# Patient Record
Sex: Male | Born: 1995 | Hispanic: Yes | Marital: Single | Smoking: Never smoker
Health system: Southern US, Community
[De-identification: ages and names within clinical notes are randomized; demographics above are authoritative.]

## PROBLEM LIST (undated history)

## (undated) DIAGNOSIS — Z789 Other specified health status: Secondary | ICD-10-CM

---

## 2018-07-24 ENCOUNTER — Emergency Department (HOSPITAL_COMMUNITY): Payer: Self-pay

## 2018-07-24 ENCOUNTER — Emergency Department (HOSPITAL_COMMUNITY)
Admission: EM | Admit: 2018-07-24 | Discharge: 2018-07-24 | Disposition: A | Discharge status: Home / Self Care | Payer: Self-pay | Attending: Emergency Medicine | Admitting: Emergency Medicine

## 2018-07-24 ENCOUNTER — Encounter (HOSPITAL_COMMUNITY): Payer: Self-pay | Admitting: Emergency Medicine

## 2018-07-24 ENCOUNTER — Other Ambulatory Visit: Payer: Self-pay

## 2018-07-24 DIAGNOSIS — S42025A Nondisplaced fracture of shaft of left clavicle, initial encounter for closed fracture: Secondary | ICD-10-CM | POA: Insufficient documentation

## 2018-07-24 DIAGNOSIS — S0990XA Unspecified injury of head, initial encounter: Secondary | ICD-10-CM | POA: Insufficient documentation

## 2018-07-24 DIAGNOSIS — Z23 Encounter for immunization: Secondary | ICD-10-CM | POA: Insufficient documentation

## 2018-07-24 DIAGNOSIS — Y929 Unspecified place or not applicable: Secondary | ICD-10-CM | POA: Insufficient documentation

## 2018-07-24 DIAGNOSIS — Y999 Unspecified external cause status: Secondary | ICD-10-CM | POA: Insufficient documentation

## 2018-07-24 DIAGNOSIS — W132XXA Fall from, out of or through roof, initial encounter: Secondary | ICD-10-CM

## 2018-07-24 DIAGNOSIS — Y9389 Activity, other specified: Secondary | ICD-10-CM | POA: Insufficient documentation

## 2018-07-24 DIAGNOSIS — W11XXXA Fall on and from ladder, initial encounter: Secondary | ICD-10-CM | POA: Insufficient documentation

## 2018-07-24 HISTORY — DX: Other specified health status: Z78.9

## 2018-07-24 MED ORDER — TETANUS-DIPHTH-ACELL PERTUSSIS 5-2.5-18.5 LF-MCG/0.5 IM SUSP
0.5000 mL | Freq: Once | INTRAMUSCULAR | Status: AC
  Administered 2018-07-24: 0.5 mL via INTRAMUSCULAR
  Filled 2018-07-24: qty 0.5

## 2018-07-24 MED ORDER — HYDROMORPHONE HCL 1 MG/ML IJ SOLN
1.0000 mg | Freq: Once | INTRAMUSCULAR | Status: AC
  Administered 2018-07-24: 1 mg via INTRAVENOUS
  Filled 2018-07-24: qty 1

## 2018-07-24 MED ORDER — CYCLOBENZAPRINE HCL 10 MG PO TABS
10.0000 mg | ORAL_TABLET | Freq: Once | ORAL | Status: AC
  Administered 2018-07-24: 10 mg via ORAL
  Filled 2018-07-24: qty 1

## 2018-07-24 MED ORDER — MORPHINE SULFATE (PF) 4 MG/ML IV SOLN
4.0000 mg | Freq: Once | INTRAVENOUS | Status: AC
  Administered 2018-07-24: 4 mg via INTRAVENOUS
  Filled 2018-07-24: qty 1

## 2018-07-24 MED ORDER — OXYCODONE HCL 5 MG PO TABS: 5.0000 mg | ORAL_TABLET | Freq: Four times a day (QID) | ORAL | 0 refills | Status: AC | PRN

## 2018-07-24 MED ORDER — CYCLOBENZAPRINE HCL 10 MG PO TABS: 10.0000 mg | ORAL_TABLET | Freq: Two times a day (BID) | ORAL | 0 refills | Status: AC | PRN

## 2018-07-24 NOTE — ED Triage Notes (Signed)
PT to ED via EMS after falling around 12 feet from scaffolding. Hematoma to L posterior head. Pain and tenderness to L shoulder. No LOC per patient. C collar in place.  50 mcg Fentanyl given PTA.

## 2018-07-24 NOTE — ED Triage Notes (Signed)
Pt c/o Left shoulder pain and L head pain. Reports that he was on a ladder on the 2nd floor of the house when the ladder slipped from underneath him. Denies any medical hx, surgery, allergies, or substance use. Denies pain to his back legs hip, and neck. Neurological exam negative

## 2018-07-24 NOTE — ED Notes (Signed)
Patient transported to CT 

## 2018-07-24 NOTE — Discharge Instructions (Addendum)
1. Medications: Alternate 600 mg of ibuprofen and [phone] mg of Tylenol every 3 hours as needed for pain. Do not exceed 4000 mg of Tylenol daily.  Take ibuprofen with food to avoid upset stomach issues.  Take oxycodone as needed only for severe pain but do not drive, drink alcohol, operate heavy machinery, or make important decisions while taking this medicine as it may make you drowsy.  The same applies for the medication Flexeril (cyclobenzaprine) which is a muscle relaxer.  I recommend taking these medications only at night when you know you are not driving.  You can also cut these tablets in half if they are too strong. 2. Treatment: rest, apply ice for 20 minutes at a time up to 3 times daily as needed for swelling, use the shoulder sling for comfort but take your arm out of it a couple of times a day and do some stretching to avoid muscle stiffness, drink plenty of fluids.  For concussion symptoms (headache, vision changes, confusion, nausea) I recommend staying in a dimly lit room with minimal stimuli.  Try to reduce screen time (phone, TV) by at least 50% if not more.  Avoid contact sports until cleared by physician. 3. Follow Up: Please followup with orthopedics in 1 week if no improvement for discussion of your diagnoses and further evaluation after today's visit; Please return to the ER for worsening symptoms or other concerns such as worsening swelling, redness of the skin, fevers, loss of pulses, severe headaches, loss of vision, persistent vomiting, or loss of feeling

## 2018-07-24 NOTE — ED Provider Notes (Signed)
[facility] EMERGENCY DEPARTMENT Provider Note   CSN: [id number] Arrival date & time: 07/24/18  1648     History   Chief Complaint Chief Complaint  Patient presents with  . Fall    HPI Christopher Kim is a 22 y.o. male presents for evaluation of acute onset, constant left shoulder and left-sided headache secondary to injury just prior to arrival.  Patient was working on an [facility] unit while on a ladder on the second floor when he states the ladder gave out.  He thinks he fell from a height of around 15 feet.  Denies any prodrome leading up to the fall including lightheadedness, chest pain, or shortness of breath.  He states that his left shoulder struck the ladder and he landed on the left side of his head.  Denies loss of consciousness.  Currently notes headache and left shoulder pain, denies neck pain or low back pain.  He denies chest pain, shortness of breath, numbness, weakness, vision changes, nausea, vomiting, or abdominal pain. Patient is primarily Spanish-speaking, translator was used throughout the encounter.  The history is provided by the patient. The history is limited by a language barrier. A language interpreter was used.    Past Medical History:  Diagnosis Date  . Patient denies medical problems     There are no active problems to display for this patient.   History reviewed. No pertinent surgical history.      Home Medications    Prior to Admission medications   Medication Sig Start Date End Date Taking? Authorizing Provider  cyclobenzaprine (FLEXERIL) 10 MG tablet Take 1 tablet (10 mg total) by mouth 2 (two) times daily as needed for muscle spasms. 07/24/18   Kaylise Blakeley A, PA-C  oxyCODONE (ROXICODONE) 5 MG immediate release tablet Take 1 tablet (5 mg total) by mouth every 6 (six) hours as needed for severe pain. 07/24/18   Jeanie Sewer, PA-C    Family History No family history on file.  Social History Social History   Tobacco Use  . Smoking  status: Never Smoker  . Smokeless tobacco: Never Used  Substance Use Topics  . Alcohol use: Never    Frequency: Never  . Drug use: Never     Allergies   Patient has no known allergies.   Review of Systems Review of Systems  Eyes: Negative for visual disturbance.  Respiratory: Negative for shortness of breath.   Cardiovascular: Negative for chest pain.  Gastrointestinal: Negative for abdominal pain, nausea and vomiting.  Musculoskeletal: Positive for arthralgias.  Skin: Positive for wound.  Neurological: Positive for headaches. Negative for syncope, weakness and numbness.  All other systems reviewed and are negative.    Physical Exam Updated Vital Signs BP 111/87   Pulse 78   Temp 97.7 F (36.5 C)   Resp 18   Ht 5\' 10"  (1.778 m)   Wt 76.7 kg   SpO2 97%   BMI 24.25 kg/m   Physical Exam  Constitutional: He is oriented to person, place, and time. He appears well-developed and well-nourished. No distress.  HENT:  Head: Normocephalic.  Superficial abrasions noted to the left parieto-occipital region.  Bleeding controlled.  There is tenderness to palpation and swelling around this area.  Left hemotympanum noted.  No raccoon eyes, rhinorrhea, or battle signs.  No tenderness to palpation of the face.  No jaw malocclusion.  Eyes: Pupils are equal, round, and reactive to light. Conjunctivae and EOM are normal. Right eye exhibits no discharge. Left  eye exhibits no discharge.  Neck: No JVD present. No tracheal deviation present.  In C-collar  Cardiovascular: Normal rate, regular rhythm, normal heart sounds and intact distal pulses.  2+ radial and DP/PT pulses bilaterally, no lower extremity edema, no palpable cords, compartments are soft   Pulmonary/Chest: Effort normal and breath sounds normal. He exhibits tenderness.  Redness to palpation overlying the left clavicle.  No tenderness to palpation of the chest otherwise.  No flail segment.  Lungs clear to auscultation  bilaterally, speaking in full sentences without difficulty.  Abdominal: Soft. Bowel sounds are normal. He exhibits no distension. There is no tenderness. There is no guarding.  Musculoskeletal: He exhibits no edema.  No midline spine TTP, no paraspinal muscle tenderness, no deformity, crepitus, or step-off noted.  5/5 strength of BUE and BLE major muscle groups.  Examination limited secondary to left shoulder pain.  Pelvis appears stable.  Neurological: He is alert and oriented to person, place, and time. No cranial nerve deficit or sensory deficit. He exhibits normal muscle tone.  Fluent speech with no evidence of dysarthria or aphasia, no facial droop, sensation intact to soft touch of upper and lower extremities bilaterally.  Skin: Skin is warm and dry. No erythema.  Superficial abrasions noted to the left shoulder.  Bleeding controlled  Psychiatric: He has a normal mood and affect. His behavior is normal.  Nursing note and vitals reviewed.    ED Treatments / Results  Labs (all labs ordered are listed, but only abnormal results are displayed) Labs Reviewed - No data to display  EKG None  Radiology Dg Chest 2 View  Result Date: 07/24/2018 CLINICAL DATA:  Trauma EXAM: CHEST - 2 VIEW COMPARISON:  Shoulder radiograph 07/24/2018 FINDINGS: No focal opacity or pleural effusion. Normal cardiomediastinal silhouette. No pneumothorax. Acute minimally displaced left mid clavicle fracture. Possible small calcified granuloma in the right CP angle. IMPRESSION: 1. Acute minimally displaced left mid clavicle fracture. 2. Negative for pneumothorax or acute airspace disease. Electronically Signed   By: Jasmine Pang M.D.   On: 07/24/2018 18:00   Ct Head Wo Contrast  Result Date: 07/24/2018 CLINICAL DATA:  Fall with head injury. EXAM: CT HEAD WITHOUT CONTRAST CT CERVICAL SPINE WITHOUT CONTRAST TECHNIQUE: Multidetector CT imaging of the head and cervical spine was performed following the standard protocol  without intravenous contrast. Multiplanar CT image reconstructions of the cervical spine were also generated. COMPARISON:  None. FINDINGS: CT HEAD FINDINGS Brain: There is no evidence for acute hemorrhage, hydrocephalus, mass lesion, or abnormal extra-axial fluid collection. No definite CT evidence for acute infarction. Vascular: No hyperdense vessel or unexpected calcification. Skull: No evidence for fracture. No worrisome lytic or sclerotic lesion. Sinuses/Orbits: Visualized portion the upper right maxillary sinus is opacified. The remaining visualized paranasal sinuses and mastoid air cells are clear. Visualized portions of the globes and intraorbital fat are unremarkable. Other: Left parietooccipital scalp hematoma evident. CT CERVICAL SPINE FINDINGS Alignment: Reversal of normal cervical lordosis.  No subluxation. Skull base and vertebrae: No acute fracture. No primary bone lesion or focal pathologic process. Soft tissues and spinal canal: No prevertebral fluid or swelling. No visible canal hematoma. Disc levels: Intervertebral disc spaces are preserved throughout. The facets are well aligned bilaterally. Upper chest: Negative. Other: None. IMPRESSION: 1. Left parietooccipital scalp hematoma without acute intracranial abnormality. 2. No cervical spine fracture. 3. Loss of cervical lordosis. This can be related to patient positioning, muscle spasm or soft tissue injury. Electronically Signed   By: Jamison Oka.D.  On: 07/24/2018 18:22   Ct Cervical Spine Wo Contrast  Result Date: 07/24/2018 CLINICAL DATA:  Fall with head injury. EXAM: CT HEAD WITHOUT CONTRAST CT CERVICAL SPINE WITHOUT CONTRAST TECHNIQUE: Multidetector CT imaging of the head and cervical spine was performed following the standard protocol without intravenous contrast. Multiplanar CT image reconstructions of the cervical spine were also generated. COMPARISON:  None. FINDINGS: CT HEAD FINDINGS Brain: There is no evidence for acute  hemorrhage, hydrocephalus, mass lesion, or abnormal extra-axial fluid collection. No definite CT evidence for acute infarction. Vascular: No hyperdense vessel or unexpected calcification. Skull: No evidence for fracture. No worrisome lytic or sclerotic lesion. Sinuses/Orbits: Visualized portion the upper right maxillary sinus is opacified. The remaining visualized paranasal sinuses and mastoid air cells are clear. Visualized portions of the globes and intraorbital fat are unremarkable. Other: Left parietooccipital scalp hematoma evident. CT CERVICAL SPINE FINDINGS Alignment: Reversal of normal cervical lordosis.  No subluxation. Skull base and vertebrae: No acute fracture. No primary bone lesion or focal pathologic process. Soft tissues and spinal canal: No prevertebral fluid or swelling. No visible canal hematoma. Disc levels: Intervertebral disc spaces are preserved throughout. The facets are well aligned bilaterally. Upper chest: Negative. Other: None. IMPRESSION: 1. Left parietooccipital scalp hematoma without acute intracranial abnormality. 2. No cervical spine fracture. 3. Loss of cervical lordosis. This can be related to patient positioning, muscle spasm or soft tissue injury. Electronically Signed   By: Kennith Center M.D.   On: 07/24/2018 18:22   Dg Shoulder Left  Result Date: 07/24/2018 CLINICAL DATA:  Left shoulder pain. Fall from a ladder. Left shoulder abrasions. EXAM: LEFT SHOULDER - 2+ VIEW COMPARISON:  None. FINDINGS: Midshaft fracture of the clavicle noted, with discontinuity of the lower cortex more than the upper cortex, almost resembling a greenstick morphology. Minimal apex inferior angulation. AC joint and glenohumeral alignment appears grossly normal. No appreciable scapular fracture. Subtle cortical irregularity laterally in the left second rib could indicate a nondisplaced second rib fracture. IMPRESSION: 1. Left mid clavicular fracture, mild cortical discontinuity along the inferior  surface, almost resembling a greenstick morphology although such fractures are more common in the pediatric population. 2. Questionable nondisplaced fracture of the left second rib laterally. Electronically Signed   By: Gaylyn Rong M.D.   On: 07/24/2018 17:56    Procedures Procedures (including critical care time)  Medications Ordered in ED Medications  Tdap (BOOSTRIX) injection 0.5 mL (0.5 mLs Intramuscular Given 07/24/18 1820)  morphine 4 MG/ML injection 4 mg (4 mg Intravenous Given 07/24/18 1823)  cyclobenzaprine (FLEXERIL) tablet 10 mg (10 mg Oral Given 07/24/18 1908)  HYDROmorphone (DILAUDID) injection 1 mg (1 mg Intravenous Given 07/24/18 1907)     Initial Impression / Assessment and Plan / ED Course  I have reviewed the triage vital signs and the nursing notes.  Pertinent labs & imaging results that were available during my care of the patient were reviewed by me and considered in my medical decision making (see chart for details).     Patient presents for evaluation after fall from a roof while on a ladder at a height of approximately 15 feet.  No prodrome leading up to the fall, appears to be mechanical in nature.  Patient is afebrile, vital signs are stable.  He is nontoxic in appearance.  He is neurovascularly intact.  No midline spine tenderness.  Moves extremities spontaneously without difficulty.  No tenderness to palpation of the chest or abdomen.  Imaging of the head and neck show no  acute intracranial abnormality but he does have a left parieto-occipital scalp hematoma, no cervical spine injury.  He has a left mid clavicular fracture with mild cortical discontinuity along the inferior surface.  Questionable nondisplaced left lateral second rib fracture though he has no focal tenderness to this area on exam.  No evidence of serious head injury, intrathoracic or intra-abdominal injury.  Compartments are soft and there is no evidence of compartment syndrome, DVT, or secondary  skin infection. He is ambulatory without difficulty.  His tetanus was updated in the ED and he was given a shoulder sling to wear for comfort.  Pain was managed in the ED.  Discussed wound care and pain management with the patient, his uncle, and his coworker. RICE therapy indicated and discussed with patient and family.  Discussed strict ED return precautions.  Patient, family, and coworker verbalized understanding of and agreement with plan and patient is stable for discharge home at this time.  Final Clinical Impressions(s) / ED Diagnoses   Final diagnoses:  Closed nondisplaced fracture of shaft of left clavicle, initial encounter  Fall from roof, initial encounter    ED Discharge Orders         Ordered    oxyCODONE (ROXICODONE) 5 MG immediate release tablet  Every 6 hours PRN     07/24/18 2030    cyclobenzaprine (FLEXERIL) 10 MG tablet  2 times daily PRN     07/24/18 2030           Jeanie Sewer, PA-C 07/24/18 2037    Tilden Fossa, MD 07/25/18 1428

## 2020-06-16 IMAGING — CT CT CERVICAL SPINE W/O CM
5 of 8 series · 13 of 33 positions shown, 14 images · non-contrast
Comparison: None.

CLINICAL DATA: Fall with head injury.

EXAM:
CT HEAD WITHOUT CONTRAST
CT CERVICAL SPINE WITHOUT CONTRAST
TECHNIQUE: Multidetector CT imaging of the head and cervical spine was
performed following the standard protocol without intravenous
contrast. Multiplanar CT image reconstructions of the cervical spine
were also generated.

[Series 5: head bone · axial · 0.45mm/px · z∈[+1158,+1212]mm · 2 of 83 slices shown]
[im 28/83  bone]
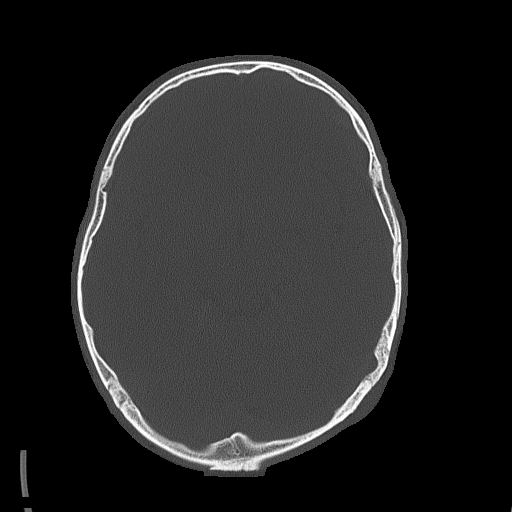
[im 55/83  bone]
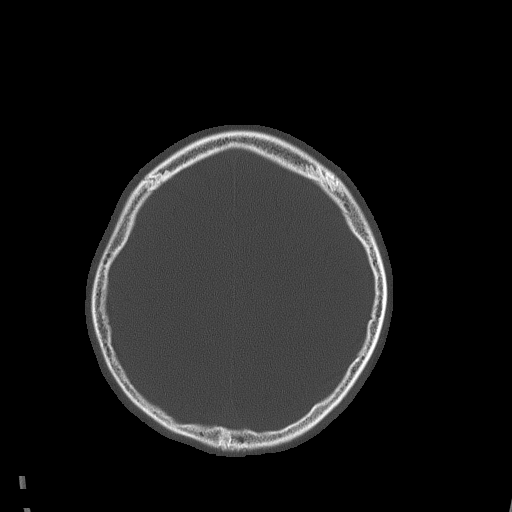

[Series 6: head without cor · coronal · non-contrast · 0.32mm/px · 2 of 63 slices shown]
[im 21/63  bone]
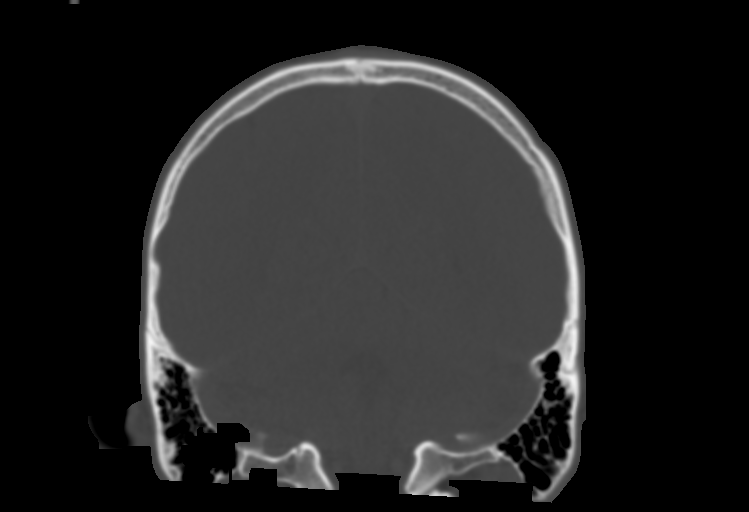
[im 42/63  bone]
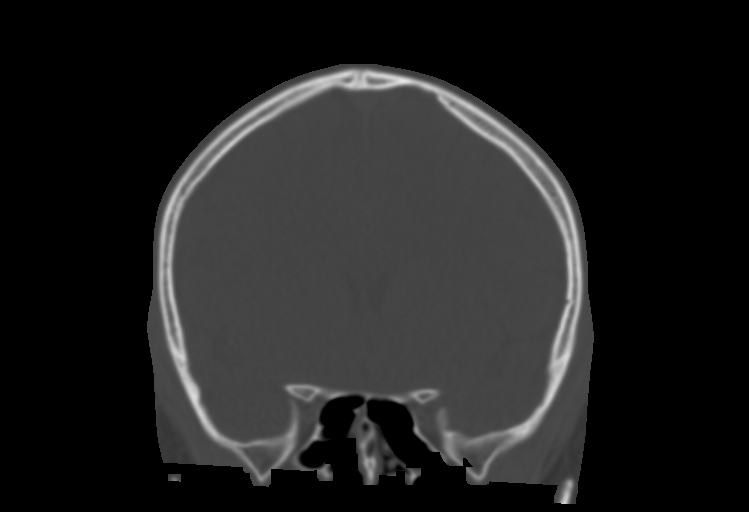

[Series 8: c_spine 2.0 st · axial · 0.36mm/px · z∈[+954,+1062]mm · 3 of 110 slices shown, 4 images]
[im 28/110  soft-tissue]
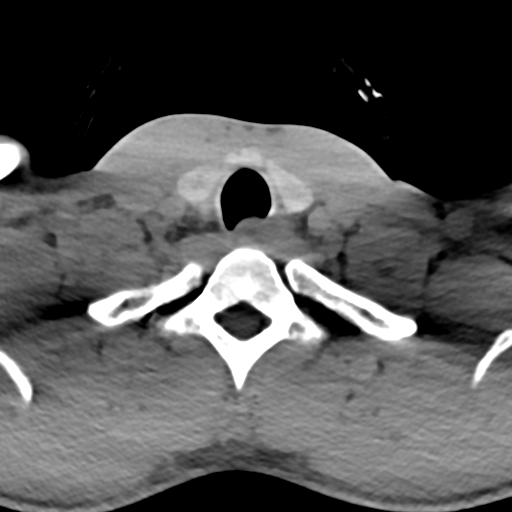
[im 28/110  bone]
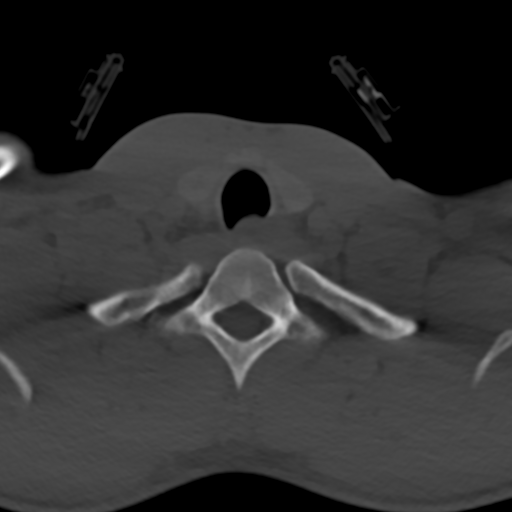
[im 55/110  bone]
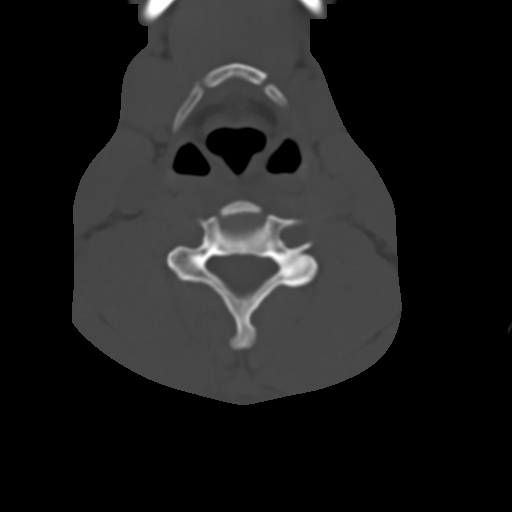
[im 82/110  bone]
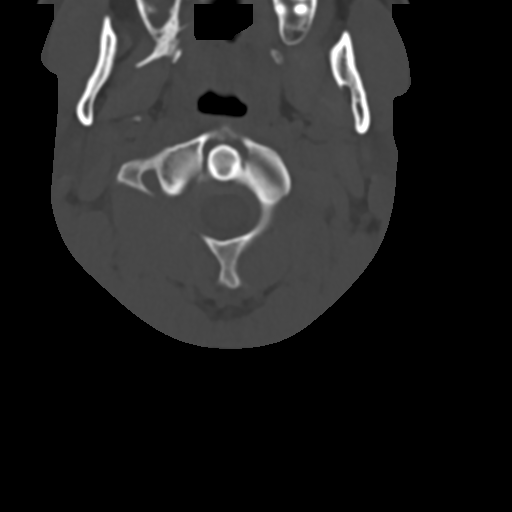

[Series 12: c_spine 2.0 sag bone · sagittal · 0.34mm/px · 4 of 44 slices shown]
[im 9/44  bone]
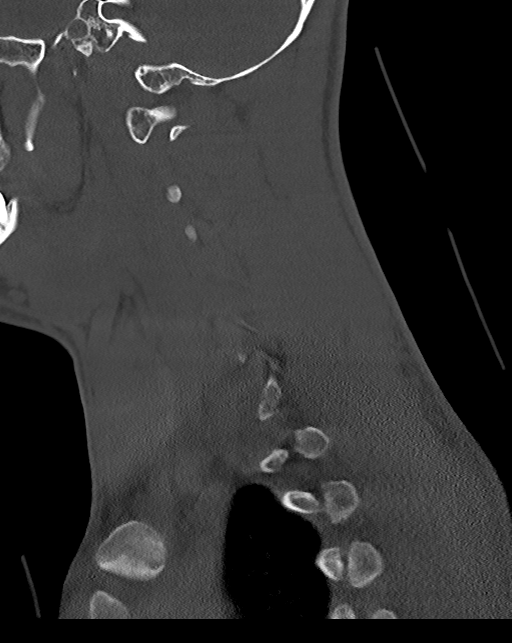
[im 18/44  bone]
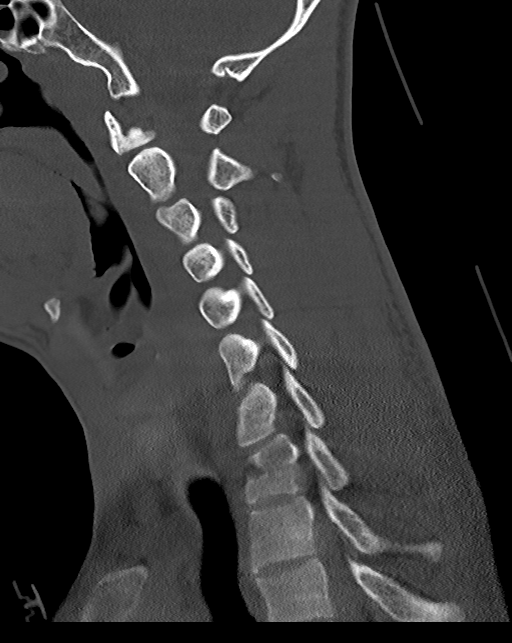
[im 26/44  bone]
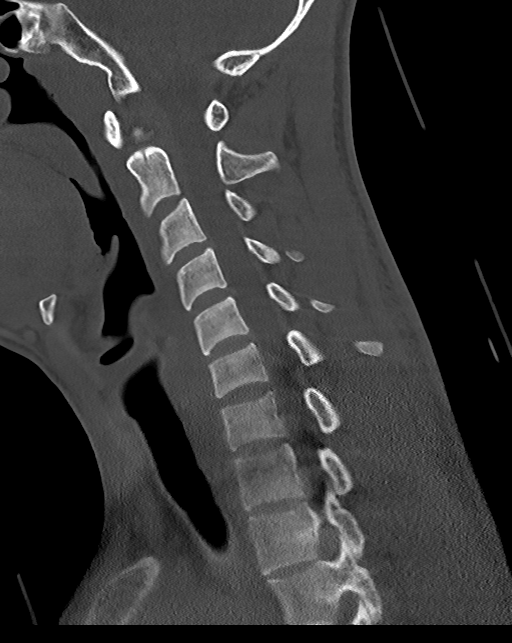
[im 35/44  bone]
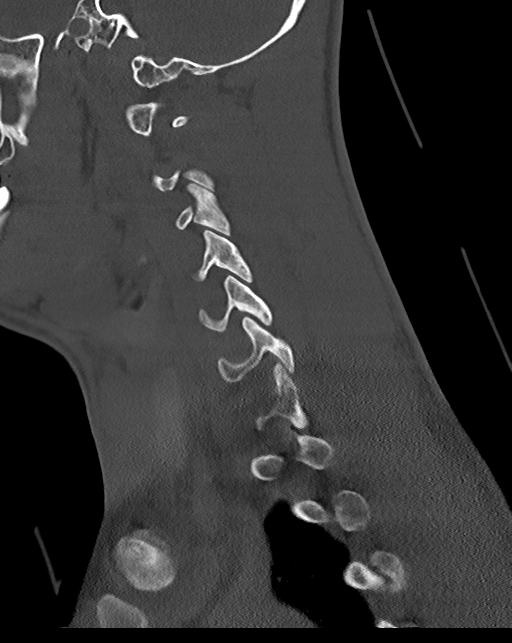

[Series 14: c_spine 2.0 orthogonals · axial · 0.21mm/px · z∈[+954,+1008]mm · 2 of 105 slices shown]
[im 35/105  bone]
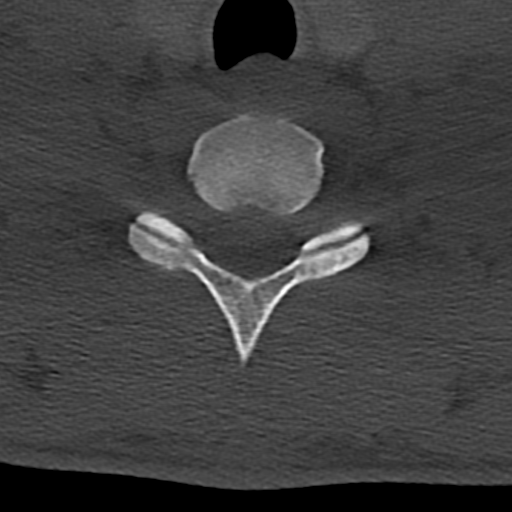
[im 70/105  bone]
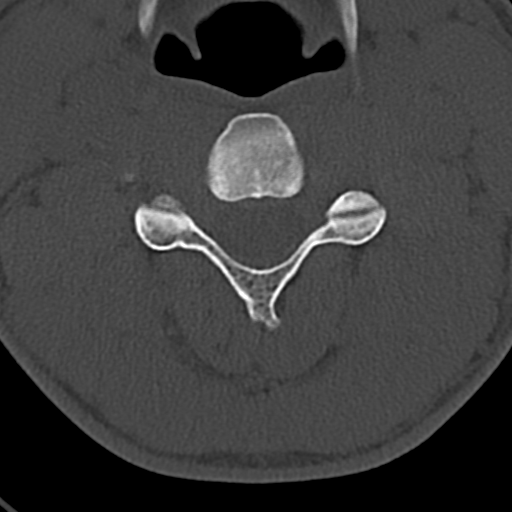

[13 of 33 positions shown; findings below may reference images not displayed]

FINDINGS: CT HEAD FINDINGS

Brain: There is no evidence for acute hemorrhage, hydrocephalus,
mass lesion, or abnormal extra-axial fluid collection. No definite
CT evidence for acute infarction.

Vascular: No hyperdense vessel or unexpected calcification.

Skull: No evidence for fracture. No worrisome lytic or sclerotic
lesion.

Sinuses/Orbits: Visualized portion the upper right maxillary sinus
is opacified. The remaining visualized paranasal sinuses and mastoid
air cells are clear. Visualized portions of the globes and
intraorbital fat are unremarkable.

Other: Left parietooccipital scalp hematoma evident.

CT CERVICAL SPINE FINDINGS

Alignment: Reversal of normal cervical lordosis.  No subluxation.

Skull base and vertebrae: No acute fracture. No primary bone lesion
or focal pathologic process.

Soft tissues and spinal canal: No prevertebral fluid or swelling. No
visible canal hematoma.

Disc levels: Intervertebral disc spaces are preserved throughout.
The facets are well aligned bilaterally.

Upper chest: Negative.

Other: None.
IMPRESSION: 1. Left parietooccipital scalp hematoma without acute intracranial
abnormality.
2. No cervical spine fracture.
3. Loss of cervical lordosis. This can be related to patient
positioning, muscle spasm or soft tissue injury.
